# Patient Record
Sex: Female | Born: 1970 | Race: White | Hispanic: No | Marital: Married | Smoking: Never smoker
Health system: Southern US, Community
[De-identification: ages and names within clinical notes are randomized; demographics above are authoritative.]

## PROBLEM LIST (undated history)

## (undated) HISTORY — PX: APPENDECTOMY: SHX54

---

## 2001-06-27 ENCOUNTER — Other Ambulatory Visit: Admission: RE | Admit: 2001-06-27 | Discharge: 2001-06-27 | Payer: Self-pay | Admitting: Obstetrics and Gynecology

## 2002-07-10 ENCOUNTER — Other Ambulatory Visit: Admission: RE | Admit: 2002-07-10 | Discharge: 2002-07-10 | Payer: Self-pay | Admitting: Obstetrics and Gynecology

## 2003-07-16 ENCOUNTER — Other Ambulatory Visit: Admission: RE | Admit: 2003-07-16 | Discharge: 2003-07-16 | Payer: Self-pay | Admitting: Obstetrics and Gynecology

## 2004-08-12 ENCOUNTER — Inpatient Hospital Stay (HOSPITAL_COMMUNITY): Admission: AD | Admit: 2004-08-12 | Discharge: 2004-08-12 | Payer: Self-pay | Admitting: Obstetrics and Gynecology

## 2004-08-14 ENCOUNTER — Inpatient Hospital Stay (HOSPITAL_COMMUNITY): Admission: AD | Admit: 2004-08-14 | Discharge: 2004-08-16 | Payer: Self-pay | Admitting: Obstetrics and Gynecology

## 2004-09-24 ENCOUNTER — Other Ambulatory Visit: Admission: RE | Admit: 2004-09-24 | Discharge: 2004-09-24 | Payer: Self-pay | Admitting: Obstetrics and Gynecology

## 2005-10-19 ENCOUNTER — Other Ambulatory Visit: Admission: RE | Admit: 2005-10-19 | Discharge: 2005-10-19 | Payer: Self-pay | Admitting: Obstetrics and Gynecology

## 2009-01-15 ENCOUNTER — Inpatient Hospital Stay (HOSPITAL_COMMUNITY): Admission: AD | Admit: 2009-01-15 | Discharge: 2009-01-17 | Payer: Self-pay | Admitting: Obstetrics and Gynecology

## 2010-12-21 LAB — CBC
HCT: 36.1 % (ref 36.0–46.0)
Hemoglobin: 13 g/dL (ref 12.0–15.0)
MCHC: 35.9 g/dL (ref 30.0–36.0)
MCHC: 36.3 g/dL — ABNORMAL HIGH (ref 30.0–36.0)
MCV: 102.8 fL — ABNORMAL HIGH (ref 78.0–100.0)
Platelets: 188 10*3/uL (ref 150–400)
RDW: 13.9 % (ref 11.5–15.5)

## 2011-01-25 NOTE — Discharge Summary (Signed)
NAMECORNELL, [name]               ACCOUNT NO.:  [id number]   MEDICAL RECORD NO.:  [id number]          PATIENT TYPE:  INP   LOCATION:  9146                          FACILITY:  [facility]   PHYSICIAN:  Sherron Monday, MD        DATE OF BIRTH:  06-Aug-1971   DATE OF ADMISSION:  01/15/2009  DATE OF DISCHARGE:  01/17/2009                               DISCHARGE SUMMARY   ADMITTING DIAGNOSIS:  Intrauterine pregnancy at term inactive labor.   DISCHARGE DIAGNOSES:  Intrauterine pregnancy at term inactive labor,  delivered via spontaneous vaginal delivery.   HISTORY OF PRESENT ILLNESS:  A 40 year old G2, P1-0-0-1 at 40 weeks and  6 days with an [facility] of Jan 16, 2009 by 7-week ultrasound presented to MAU  in active labor.  Vaginal exam revealed 8 cm dilated, completely  effaced, and 0 station with an intact bag.  Prenatal care was  uncomplicated except advanced maternal age with normal first trimester  screen.   PAST MEDICAL HISTORY:  Not significant.   PAST SURGICAL HISTORY:  Significant for appendectomy in 2000.   PAST OBSTETRICAL/GYNECOLOGICAL HISTORY:  She is a G3, P1.  She had a G1  was an 7 pound 2 ounces vaginal delivery in 2005.  G2 was a miscarriage  in 2009.  G3 is the present pregnancy.  No abnormal Pap smears or  sexually transmitted diseases.   MEDICATIONS:  Prenatal vitamins.   ALLERGIES:  The powder on latex gloves.   SOCIAL HISTORY:  Denies alcohol, tobacco, or drug use.  She is married.   PRENATAL LABS:  A positive, antibody screen negative, RPR nonreactive,  rubella immune, hepatitis B surface antigen negative, HIV negative.  Gonorrhea negative.  Chlamydia negative.  Group B strep negative.  First  trimester screen within normal limits.  One hour Glucola of 153.  Three-  hour GTT was within normal limits.   HOSPITAL COURSE:  On admission, exam was afebrile.  Vital signs stable  and a benign exam with reactive fetal heart tones.  She ruptured and  progressed rapidly to  complete.  She pushed well for approximately 15  minutes for a delivery of a viable female infant over second-degree  laceration with Apgars of 8 at 1 minute and 9 at 5 minutes and weight of  8 pounds 2 ounces.  Placenta delivered intact.  The laceration was  repaired with 2-0 Vicryl in a typical fashion.  Her postpartum course  was relatively uncomplicated.  She remained afebrile and vital signs  stable and was discharged to home on postpartum day #2 with routine  discharge instructions and numbers to call with any questions  or problems as well as prescriptions for Motrin, Percocet, and prenatal  vitamins.  She is A+ and rubella immune.  Her hemoglobin decreased from  13.0-11.4.  She was discharged to home.  She would discuss routine  contraception with Dr. Senaida Ores at her 6-week checkup.       Sherron Monday, MD  Electronically Signed     JB/MEDQ  D:  01/17/2009  T:  01/17/2009  Job:  [id number]

## 2011-01-28 NOTE — Discharge Summary (Signed)
[name], [name]               ACCOUNT NO.:  [id number]   MEDICAL RECORD NO.:  [id number]          PATIENT TYPE:  INP   LOCATION:  9112                          [facility]ILITY:  [facility]   PHYSICIAN:  Huel Cote, M.D. DATE OF BIRTH:  [birth year]   DATE OF ADMISSION:  08/14/2004  DATE OF DISCHARGE:  08/16/2004                                 DISCHARGE SUMMARY   DISCHARGE DIAGNOSES:  1.  Term pregnancy at 89 and six-sevenths weeks, delivered.  2.  Status post normal spontaneous vaginal delivery.   DISCHARGE MEDICATIONS:  Motrin 600 mg p.o. q.6h.   DISCHARGE FOLLOW-UP:  The patient is to follow up in the office in  approximately 6 weeks for her routine postpartum exam.   HOSPITAL COURSE:  The patient is a 40 year old G1 P0 who is admitted at 70  and six-sevenths weeks gestation with a complaint of regular contractions.  Cervix on admission was 5 cm with a bulging bag.  She received her epidural  and progressed to complete dilation without spontaneous rupture of  membranes.  Prenatal care was uncomplicated except for a prominent fetal  renal pelvis on initial anatomy scan and 32-week ultrasound repeat was  normal.  Prenatal labs are as follows:  A positive, antibody negative, toxo  negative, RPR nonreactive, rubella nonimmune, hepatitis B surface antigen  negative, HIV declined, GC negative, chlamydia negative, group B strep  negative, 1-hour Glucola is 127.  Past OB history:  None.  Past GYN history:  None.  Past medical history:  None.  Past surgical history:  Appendectomy.  Allergies:  None.  On admission, she was afebrile with stable vital signs.  Fetal heart rate was reactive.  Her contractions progressed well and she  reached complete dilation after admission.  She then had rupture of  membranes performed with the vertex noted at the 0 station and a transverse  presentation.  The patient then began to push and pushed for approximately 1-  and-a-half hours with a normal spontaneous  vaginal delivery of a vigorous  female infant in an ROP presentation over a second degree laceration.  Apgars were 8 and 9, weight was 7 pounds 2 ounces.  Second degree laceration  was repaired with 2-0 and 3-0 Vicryl.  Cervix and rectum were intact.  The  patient did well and on postpartum day #2 was breastfeeding well and her  pain was well controlled and she was felt stable for discharge home.     Kath   KR/MEDQ  D:  09/06/2004  T:  09/06/2004  Job:  [id number]

## 2011-08-08 ENCOUNTER — Ambulatory Visit (HOSPITAL_COMMUNITY)
Admission: AD | Admit: 2011-08-08 | Discharge: 2011-08-09 | Disposition: A | Discharge status: Home / Self Care | Payer: BC Managed Care – PPO | Source: Ambulatory Visit | Attending: Obstetrics and Gynecology | Admitting: Obstetrics and Gynecology

## 2011-08-08 ENCOUNTER — Encounter (HOSPITAL_COMMUNITY): Payer: Self-pay | Admitting: *Deleted

## 2011-08-08 DIAGNOSIS — O034 Incomplete spontaneous abortion without complication: Secondary | ICD-10-CM

## 2011-08-08 LAB — CBC
MCV: 95.3 fL (ref 78.0–100.0)
Platelets: 273 10*3/uL (ref 150–400)
RDW: 13.2 % (ref 11.5–15.5)
WBC: 8.6 10*3/uL (ref 4.0–10.5)

## 2011-08-08 MED ORDER — LACTATED RINGERS IV SOLN
INTRAVENOUS | Status: DC
  Administered 2011-08-08: 23:00:00 via INTRAVENOUS

## 2011-08-08 NOTE — H&P (Signed)
[name] is a 40 y.o. female [phone] with a known diagnosed missed abortion, Used cytotec 07/12/11 and passed some tissue, but has continued to have intermittent heavy bleeding and cramping and [place] in office today showed retained POC's. Pt came into MAU tonight with c/o severe back pain, bleeding is still light for now.  Pt very tearful with pain, has not eaten since 0600am and last drank at 1pm.  She requests D&E tonight for her pain. History OB History    Grav Para Term Preterm Abortions TAB SAB Ect Mult Living   4 2 2  1  1   2     NSVD x 2  History reviewed. No pertinent past medical history. Past Surgical History  Procedure Date  . Appendectomy    Family History: family history includes Arthritis in her mother; Cancer in her maternal grandfather and maternal grandmother; Diabetes in her father and paternal grandfather; Heart disease in her father and mother; Hypertension in her father; and Stroke in her father. Social History:  reports that she has never smoked. She does not have any smokeless tobacco history on file. She reports that she does not drink alcohol or use illicit drugs.  ROS Severe cramping and pain   Blood pressure 129/59, pulse 80, temperature 98.7 F (37.1 C), resp. rate 16, height 5\' 1"  (1.549 m), weight 69.854 kg (154 lb), last menstrual period 05/09/2011, SpO2 98.00%. Exam Physical Exam  Constitutional: She appears well-developed and well-nourished.  Cardiovascular: Normal rate and regular rhythm.   Respiratory: Effort normal and breath sounds normal.  GI: Soft.  Genitourinary: Vagina normal and uterus normal.       Cervix closed with scant blood in vault  Neurological: She is alert.    Prenatal labs: ABO, Rh:  Rh positive Antibody:   Rubella:   RPR:    HBsAg:    HIV:    GBS:     Assessment/Plan: Pt for D&E tomorrow but requesting tonight due to severe pain.  Bleeding is not heavy currently.  D/w anesthesia and they are agreeable when OR  available--C/S currently underway. Risks of surgery d/w pt in detail (bleeding and uterine [perforation) and she desires to proceed.  Oliver Pila 08/08/2011, 10:32 PM

## 2011-08-08 NOTE — Progress Notes (Signed)
Pt passed golf ball sized clots this past Friday.  Sat and Sun were mild days, pt worked all day today with increased bleeding and low back pain that has increased over today.  Went to office and was scanned to see that there is still membranes and blood flow in uterus.  Scheduled for D&E in AM.  Came in tonight because low back pain is so bad.

## 2011-08-08 NOTE — Progress Notes (Signed)
Pt LMP 05/09/2011, MAB, cytotec 10/30, had bleeding an cramping off and on, passed tissue.  Bleeding and cramping increased today had U/s in the office, retained products noted.  Pt scheduled for D and E 11/27.  Increased pain tonight.

## 2011-08-09 ENCOUNTER — Encounter (HOSPITAL_COMMUNITY)
Admission: AD | Disposition: A | Discharge status: Home / Self Care | Payer: Self-pay | Source: Ambulatory Visit | Attending: Obstetrics and Gynecology

## 2011-08-09 ENCOUNTER — Encounter (HOSPITAL_COMMUNITY): Payer: Self-pay | Admitting: Anesthesiology

## 2011-08-09 ENCOUNTER — Other Ambulatory Visit: Payer: Self-pay | Admitting: Obstetrics and Gynecology

## 2011-08-09 ENCOUNTER — Ambulatory Visit: Admit: 2011-08-09 | Payer: Self-pay | Admitting: Obstetrics and Gynecology

## 2011-08-09 ENCOUNTER — Ambulatory Visit (HOSPITAL_COMMUNITY)
Admission: RE | Admit: 2011-08-09 | Payer: BC Managed Care – PPO | Source: Ambulatory Visit | Admitting: Obstetrics and Gynecology

## 2011-08-09 SURGERY — DILATION AND EVACUATION, UTERUS
Anesthesia: Choice

## 2011-08-09 SURGERY — Surgical Case
Anesthesia: *Unknown

## 2011-08-09 SURGERY — DILATION AND EVACUATION, UTERUS
Anesthesia: Monitor Anesthesia Care

## 2011-08-09 SURGICAL SUPPLY — 19 items
CATH ROBINSON RED A/P 16FR (CATHETERS) ×2 IMPLANT
CLOTH BEACON ORANGE TIMEOUT ST (SAFETY) ×2 IMPLANT
DECANTER SPIKE VIAL GLASS SM (MISCELLANEOUS) ×2 IMPLANT
DRAPE UTILITY XL STRL (DRAPES) ×2 IMPLANT
GLOVE BIO SURGEON STRL SZ8 (GLOVE) ×2 IMPLANT
GLOVE ORTHO TXT STRL SZ7.5 (GLOVE) ×2 IMPLANT
GOWN PREVENTION PLUS LG XLONG (DISPOSABLE) ×2 IMPLANT
KIT BERKELEY 1ST TRIMESTER 3/8 (MISCELLANEOUS) ×2 IMPLANT
NEEDLE SPNL 22GX3.5 QUINCKE BK (NEEDLE) ×2 IMPLANT
NS IRRIG 1000ML POUR BTL (IV SOLUTION) ×2 IMPLANT
PACK VAGINAL MINOR WOMEN LF (CUSTOM PROCEDURE TRAY) ×2 IMPLANT
PAD PREP 24X48 CUFFED NSTRL (MISCELLANEOUS) ×2 IMPLANT
SET BERKELEY SUCTION TUBING (SUCTIONS) ×2 IMPLANT
SYR CONTROL 10ML LL (SYRINGE) ×2 IMPLANT
TOWEL OR 17X24 6PK STRL BLUE (TOWEL DISPOSABLE) ×4 IMPLANT
VACURETTE 10 RIGID CVD (CANNULA) IMPLANT
VACURETTE 7MM CVD STRL WRAP (CANNULA) IMPLANT
VACURETTE 8 RIGID CVD (CANNULA) IMPLANT
VACURETTE 9 RIGID CVD (CANNULA) IMPLANT

## 2011-08-09 NOTE — Anesthesia Preprocedure Evaluation (Addendum)
Anesthesia Evaluation  Patient identified by MRN, date of birth, ID band Patient awake    Reviewed: Allergy & Precautions, H&P , Patient's Chart, lab work & pertinent test results, reviewed documented beta blocker date and time   Airway Mallampati: II TM Distance: >3 FB Neck ROM: full    Dental No notable dental hx.    Pulmonary  clear to auscultation  Pulmonary exam normal       Cardiovascular regular Normal    Neuro/Psych    GI/Hepatic   Endo/Other    Renal/GU      Musculoskeletal   Abdominal   Peds  Hematology   Anesthesia Other Findings   Reproductive/Obstetrics                           Anesthesia Physical Anesthesia Plan  ASA: I and Emergent  Anesthesia Plan: MAC   Post-op Pain Management:    Induction: Intravenous  Airway Management Planned: Oral ETT and Mask  Additional Equipment:   Intra-op Plan:   Post-operative Plan:   Informed Consent: I have reviewed the patients History and Physical, chart, labs and discussed the procedure including the risks, benefits and alternatives for the proposed anesthesia with the patient or authorized representative who has indicated his/her understanding and acceptance.   Dental Advisory Given  Plan Discussed with: CRNA and Surgeon  Anesthesia Plan Comments: (  Discussed  general anesthesia, including possible nausea, instrumentation of airway, sore throat,pulmonary aspiration, etc. I asked if the were any outstanding questions, or  concerns before we proceeded. )        Anesthesia Quick Evaluation

## 2011-08-09 NOTE — Anesthesia Preprocedure Evaluation (Deleted)
Anesthesia Evaluation  Patient identified by MRN, date of birth, ID band Patient awake    Reviewed: Allergy & Precautions, H&P , Patient's Chart, lab work & pertinent test results, reviewed documented beta blocker date and time   Airway Mallampati: II TM Distance: >3 FB Neck ROM: full    Dental No notable dental hx.    Pulmonary  clear to auscultation  Pulmonary exam normal       Cardiovascular regular Normal    Neuro/Psych    GI/Hepatic   Endo/Other    Renal/GU      Musculoskeletal   Abdominal   Peds  Hematology   Anesthesia Other Findings   Reproductive/Obstetrics                           Anesthesia Physical Anesthesia Plan  ASA: II and Emergent  Anesthesia Plan: MAC   Post-op Pain Management:    Induction: Intravenous  Airway Management Planned: Mask  Additional Equipment:   Intra-op Plan:   Post-operative Plan:   Informed Consent: I have reviewed the patients History and Physical, chart, labs and discussed the procedure including the risks, benefits and alternatives for the proposed anesthesia with the patient or authorized representative who has indicated his/her understanding and acceptance.   Dental Advisory Given  Plan Discussed with: CRNA and Surgeon  Anesthesia Plan Comments: (  Discussed  general anesthesia, including possible nausea, instrumentation of airway, sore throat,pulmonary aspiration, etc. I asked if the were any outstanding questions, or  concerns before we proceeded. )        Anesthesia Quick Evaluation

## 2011-08-09 NOTE — Op Note (Signed)
Preoperative diagnosis Incomplete abortion with retained POC's  Postoperative diagnosis  Same  Procedure Dilation and evacuation  Surgeon Huel Cote, MD  Anesthesia MAC and paracervical block  Findings Retained POC's uterus sounded 7 cm  Fluids EBL minimal IVF 1200 cc LR  Specimen POC's to pathology  Procedure Note  After informed consent obtained, pt was taken to the operating room where sedation anesthesia was obtained without difficulty.  An appropriate timeout was performed, and pt was prepped in the dorsal lithotomy position in the normal sterile fashion.  A speculum placed within the vagina exposed the cervix which was injected with 1% plain lidocaine in a paracervical block with 20 cc.  The uterus easily sounded to 7cm and the pratt dilators passed easily through the cervix which appeared dilated.  A 7mm suction curette was introduced into the cavity and in several passes a small amount of POC's obtained.  Suction was discontinued and a sharp curettage performed with minimal tissue noted and the cavity felt clear.  Two additional passes with the suction revealed no further tissue.  The instruments were removed from the vagina and the tenaculum site treated with silver nitrate.  No active bleeding was noted.  The pt was taken to PACU in good condition.

## 2011-08-09 NOTE — Anesthesia Postprocedure Evaluation (Signed)
  Anesthesia Post-op Note  Patient: [name]  Procedure(s) Performed:  DILATATION AND EVACUATION (D&E)  Patient is awake and responsive. Pain and nausea are reasonably well controlled. Vital signs are stable and clinically acceptable. Oxygen saturation is clinically acceptable. There are no apparent anesthetic complications at this time. Patient is ready for discharge.

## 2011-08-09 NOTE — Brief Op Note (Signed)
08/08/2011 - 2011/08/26  1:10 AM  PATIENT:  [name]  40 y.o. female  PRE-OPERATIVE DIAGNOSIS:  Incomplete abortion with retained products of conception  POST-OPERATIVE DIAGNOSIS:  same  PROCEDURE:  Procedure(s): DILATATION AND EVACUATION (D&E)  SURGEON:  Huel Cote  ANESTHESIA:   IV sedation and paracervical block  EBL:     BLOOD ADMINISTERED:none  DRAINS: none   LOCAL MEDICATIONS USED:  LIDOCAINE 20CC 1% plain  SPECIMEN: POC's  DISPOSITION OF SPECIMEN:  PATHOLOGY  COUNTS:  YES  DICTATION: .Dragon Dictation  PLAN OF CARE: Discharge to home after PACU  PATIENT DISPOSITION:  PACU - hemodynamically stable.

## 2011-08-11 ENCOUNTER — Encounter (HOSPITAL_COMMUNITY): Payer: Self-pay | Admitting: Obstetrics and Gynecology

## 2011-08-13 DEATH — deceased

## 2011-08-18 ENCOUNTER — Encounter (HOSPITAL_COMMUNITY): Payer: Self-pay | Admitting: Obstetrics and Gynecology

## 2014-07-14 ENCOUNTER — Encounter (HOSPITAL_COMMUNITY): Payer: Self-pay | Admitting: Obstetrics and Gynecology

## 2014-09-25 ENCOUNTER — Encounter (HOSPITAL_COMMUNITY): Payer: Self-pay | Admitting: Obstetrics and Gynecology

## 2015-02-19 ENCOUNTER — Encounter (HOSPITAL_COMMUNITY): Payer: Self-pay | Admitting: Obstetrics and Gynecology

## 2015-11-17 ENCOUNTER — Institutional Professional Consult (permissible substitution): Payer: Self-pay | Admitting: Internal Medicine

## 2015-11-18 ENCOUNTER — Other Ambulatory Visit (INDEPENDENT_AMBULATORY_CARE_PROVIDER_SITE_OTHER): Payer: BLUE CROSS/BLUE SHIELD

## 2015-11-18 ENCOUNTER — Ambulatory Visit (INDEPENDENT_AMBULATORY_CARE_PROVIDER_SITE_OTHER): Payer: BLUE CROSS/BLUE SHIELD | Admitting: Internal Medicine

## 2015-11-18 ENCOUNTER — Encounter (INDEPENDENT_AMBULATORY_CARE_PROVIDER_SITE_OTHER): Payer: Self-pay

## 2015-11-18 ENCOUNTER — Ambulatory Visit (INDEPENDENT_AMBULATORY_CARE_PROVIDER_SITE_OTHER)
Admission: RE | Admit: 2015-11-18 | Discharge: 2015-11-18 | Disposition: A | Discharge status: Home / Self Care | Payer: BLUE CROSS/BLUE SHIELD | Source: Ambulatory Visit | Attending: Internal Medicine | Admitting: Internal Medicine

## 2015-11-18 ENCOUNTER — Encounter: Payer: Self-pay | Admitting: Internal Medicine

## 2015-11-18 VITALS — BP 122/84 | HR 100 | Ht 61.0 in | Wt 161.0 lb

## 2015-11-18 DIAGNOSIS — R05 Cough: Secondary | ICD-10-CM

## 2015-11-18 DIAGNOSIS — R058 Other specified cough: Secondary | ICD-10-CM

## 2015-11-18 DIAGNOSIS — J45991 Cough variant asthma: Secondary | ICD-10-CM | POA: Diagnosis not present

## 2015-11-18 LAB — CBC WITH DIFFERENTIAL/PLATELET
Basophils Absolute: 0.1 10*3/uL (ref 0.0–0.1)
Basophils Relative: 1.2 % (ref 0.0–3.0)
Eosinophils Absolute: 1.1 10*3/uL — ABNORMAL HIGH (ref 0.0–0.7)
Eosinophils Relative: 11.9 % — ABNORMAL HIGH (ref 0.0–5.0)
HEMATOCRIT: 41.1 % (ref 36.0–46.0)
Hemoglobin: 14.6 g/dL (ref 12.0–15.0)
LYMPHS PCT: 25.7 % (ref 12.0–46.0)
Lymphs Abs: 2.3 10*3/uL (ref 0.7–4.0)
MCHC: 35.5 g/dL (ref 30.0–36.0)
MCV: 95.3 fl (ref 78.0–100.0)
MONOS PCT: 5.6 % (ref 3.0–12.0)
Monocytes Absolute: 0.5 10*3/uL (ref 0.1–1.0)
NEUTROS ABS: 4.9 10*3/uL (ref 1.4–7.7)
Neutrophils Relative %: 55.6 % (ref 43.0–77.0)
PLATELETS: 318 10*3/uL (ref 150.0–400.0)
RBC: 4.31 Mil/uL (ref 3.87–5.11)
RDW: 13.4 % (ref 11.5–15.5)
WBC: 8.9 10*3/uL (ref 4.0–10.5)

## 2015-11-18 LAB — SEDIMENTATION RATE: Sed Rate: 7 mm/hr (ref 0–22)

## 2015-11-18 MED ORDER — PREDNISONE 10 MG PO TABS: ORAL_TABLET | ORAL | Status: DC

## 2015-11-18 MED ORDER — FAMOTIDINE 20 MG PO TABS: ORAL_TABLET | ORAL | Status: DC

## 2015-11-18 MED ORDER — PANTOPRAZOLE SODIUM 40 MG PO TBEC: 40.0000 mg | DELAYED_RELEASE_TABLET | Freq: Every day | ORAL | Status: DC

## 2015-11-18 NOTE — Patient Instructions (Addendum)
Please remember to go to the lab and x-ray department downstairs for your tests - we will call you with the results when they are available.  The key to effective treatment for your cough is eliminating the non-stop cycle of cough you're stuck in long enough to let your airway heal completely and then see if there is anything still making you cough once you stop the cough suppression, but this should take no more than 5 days to figure out  First take delsym two tsp every 12 hours and supplement if needed with  tramadol 50 mg up to 2 every 4 hours to suppress the urge to cough at all or even clear your throat. Swallowing water or using ice chips/non mint and menthol containing candies (such as lifesavers or sugarless jolly ranchers) are also effective.  You should rest your voice and avoid activities that you know make you cough.  Once you have eliminated the cough for 3 straight days try reducing the tramadol first,  then the delsym as tolerated.    Prednisone 10 mg take  4 each am x 2 days,   2 each am x 2 days,  1 each am x 2 days and stop (this is to eliminate allergies and inflammation from coughing)  Protonix (pantoprazole) Take 30-60 min before first meal of the day and Pepcid 20 mg one bedtime plus Chlorpheniramine 4 mg x 2 at bedtime (both available over the counter)  until cough is completely gone for at least a week without the need for cough suppression  GERD (REFLUX)  is an extremely common cause of respiratory symptoms, many times with no significant heartburn at all.    It can be treated with medication, but also with lifestyle changes including avoidance of late meals, excessive alcohol, smoking cessation, and avoid fatty foods, chocolate, peppermint, colas, red wine, and acidic juices such as orange juice.  NO MINT OR MENTHOL PRODUCTS SO NO COUGH DROPS  USE HARD CANDY INSTEAD (jolley ranchers or Stover's or Lifesavers (all available in sugarless versions) NO OIL BASED VITAMINS - use  powdered substitutes.  If not better in two weeks call for next step, if all better tell your friends!

## 2015-11-18 NOTE — Progress Notes (Signed)
Subjective:     Patient ID: [name], female   DOB: 13-Mar-1971      MRN: [id number]  HPI  72 yowf vet never smoker never allergies with new onset noct cough Oct 2016 no better in [place] nor [place] D, then sick over xmas 2016  with URI dx rhintis/sinusitis at [facility] rx with augmentin/pred  x 7days 100% improved  and stayed gone x one week then same problem plus doe Oct 03 2015 then R cp under breast > dx with abn cxr [place] > dx pna/ levaquin  less intense pain but still coughing so  referred by [name] in [place] 11/18/2015    11/18/2015 1st [place] [facility] office visit/ [name]   Chief Complaint  Patient presents with  . Pulmonary Consult    Self referral. Pt c/o cough x 5 months. She was dxed with PNA 10/05/15. Her cough is prod at times with minimal thick, white sputum.  She has also noticed occ wheezing.   cough worse every single night between 1 -3am > min mucus, no purulent sputum or mucus plugs  Or significant hemoptysis/ assoc with some subjective wheeze which saba eliminates for up to 6 hours  No obvious day to day or daytime variability or assoc chronic  chest tightness,   or overt sinus or hb symptoms. No unusual exp hx or h/o childhood pna/ asthma or knowledge of premature birth.  Sleeping ok without nocturnal  or early am exacerbation  of respiratory  c/o's or need for noct saba. Also denies any obvious fluctuation of symptoms with weather or environmental changes or other aggravating or alleviating factors except as outlined above   Current Medications, Allergies, Complete Past Medical History, Past Surgical History, Family History, and Social History were reviewed in [employer] record.  ROS  The following are not active complaints unless bolded sore throat, dysphagia, dental problems, itching, sneezing,  nasal congestion or excess/ purulent secretions, ear ache,   fever, chills, sweats, unintended wt loss, classically  exertional cp, hemoptysis,   orthopnea pnd or leg swelling, presyncope, palpitations, abdominal pain, anorexia, nausea, vomiting, diarrhea  or change in bowel or bladder habits, change in stools or urine, dysuria,hematuria,  rash, arthralgias, visual complaints, headache, numbness, weakness or ataxia or problems with walking or coordination,  change in mood/affect or memory.       Review of Systems     Objective:   Physical Exam Amb wm nad with barking quality   Wt Readings from Last 3 Encounters:  11/18/15 161 lb (73.029 kg)  08/08/11 154 lb (69.854 kg)    Vital signs reviewed    HEENT: nl dentition, turbinates, and oropharynx. Nl external ear canals without cough reflex   NECK :  without JVD/Nodes/TM/ nl carotid upstrokes bilaterally   LUNGS: no acc muscle use,  Nl contour chest which is clear to A and P bilaterally with urge to cough variably  on exp   CV:  RRR  no s3 or murmur or increase in P2, no edema   ABD:  soft and nontender with nl inspiratory excursion in the supine position. No bruits or organomegaly, bowel sounds nl  MS:  Nl gait/ ext warm without deformities, calf tenderness, cyanosis or clubbing No obvious joint restrictions   SKIN: warm and dry without lesions    NEURO:  alert, approp, nl sensorium with  no motor deficits     CXR PA and Lateral:   11/18/2015 :    I personally  reviewed images and agree with radiology impression as follows:    There is no active cardiopulmonary disease.   labs 11/18/2015  Eos 1.4/ ESR 7 Allergy profile pending       Assessment:

## 2015-11-19 ENCOUNTER — Encounter: Payer: Self-pay | Admitting: Internal Medicine

## 2015-11-19 ENCOUNTER — Telehealth: Payer: Self-pay | Admitting: Internal Medicine

## 2015-11-19 LAB — RESPIRATORY ALLERGY PROFILE REGION II ~~LOC~~
Allergen, Cedar tree, t12: <0.1 kU/L
Allergen, Cottonwood, t14: <0.1 kU/L
Allergen, D pternoyssinus,d7: <0.1 kU/L
Allergen, Mouse Urine Protein, e78: <0.1 kU/L
Aspergillus fumigatus, m3: <0.1 kU/L
Bermuda Grass: <0.1 kU/L
Cat Dander: 1.57 kU/L — ABNORMAL HIGH
Common Ragweed: <0.1 kU/L
D. farinae: <0.1 kU/L
DOG DANDER: 0.48 kU/L — AB
IGE (IMMUNOGLOBULIN E), SERUM: 54 kU/L (ref <115)
Pecan/Hickory Tree IgE: <0.1 kU/L
Timothy Grass: <0.1 kU/L

## 2015-11-19 MED ORDER — TRAMADOL HCL 50 MG PO TABS: 50.0000 mg | ORAL_TABLET | ORAL | Status: AC | PRN

## 2015-11-19 NOTE — Telephone Encounter (Signed)
Pt told pharmacy that Rx for Tramadol was supposed to be sent in 11/18/15 Verbal order given via telephone, per last OV notes patient was advised to take Tramadol very 4 hours prn cough for cough suppression.. Nothing further needed.

## 2015-11-19 NOTE — Assessment & Plan Note (Signed)
The most common causes of chronic cough in immunocompetent adults include the following: upper airway cough syndrome (UACS), previously referred to as postnasal drip syndrome (PNDS), which is caused by variety of rhinosinus conditions; (2) asthma; (3) GERD; (4) chronic bronchitis from cigarette smoking or other inhaled environmental irritants; (5) nonasthmatic eosinophilic bronchitis; and (6) bronchiectasis.   These conditions, singly or in combination, have accounted for up to 94% of the causes of chronic cough in prospective studies.   Other conditions have constituted no >6% of the causes in prospective studies These have included bronchogenic carcinoma, chronic interstitial pneumonia, sarcoidosis, left ventricular failure, ACEI-induced cough, and aspiration from a condition associated with pharyngeal dysfunction.    Chronic cough is often simultaneously caused by more than one condition. A single cause has been found from 38 to 82% of the time, multiple causes from 18 to 62%. Multiply caused cough has been the result of three diseases up to 42% of the time.       The cough she demonstrated today is clearly upper airway but the previous reported response to prednisone and saba assoc with Eos of 1.4 strongly point to dx of sinusitis/ eos rhinitis/bronchitis or cough variant asthma.  For now though will try to control cyclical cough and only short term prednsone then proceed with with if recurs which will include trial of low dose ICS +/- singulair, sinus CT and possible allergy referral  I had an extended discussion with the patient reviewing all relevant studies completed to date and  lasting  35 minutes of a 60 minute initial  visit    Each maintenance medication was reviewed in detail including most importantly the difference between maintenance and prns and under what circumstances the prns are to be triggered using an action plan format that is not reflected in the computer generated  alphabetically organized AVS.    Please see instructions for details which were reviewed in writing and the patient given a copy highlighting the part that I personally wrote and discussed at today's ov.

## 2015-11-19 NOTE — Progress Notes (Signed)
Quick Note:  LMTCB ______ 

## 2015-11-20 ENCOUNTER — Telehealth: Payer: Self-pay | Admitting: Internal Medicine

## 2015-11-20 NOTE — Progress Notes (Signed)
Quick Note:  LMTCB ______ 

## 2015-11-20 NOTE — Telephone Encounter (Signed)
Sure we can do that and go over all the studies in more detail then as they may or may not be relevant depending on response to rx in the meantime

## 2015-11-20 NOTE — Telephone Encounter (Signed)
Result Note     Call patient : Studies are C/w allergies to cat > dog avoid if possible esp in bedroom   Per CXR: Result Note     Call pt: Reviewed cxr and no acute change so no change in recommendations made at ov  --  I spoke with patient about results and she verbalized understanding. She reports she does not have any animals in the bedroom. 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 She reports she is bothered the most at night. She is also requesting to go over radiology films when she comes in for her next appointment as well Please advise MW thanks

## 2015-11-20 NOTE — Telephone Encounter (Signed)
Called made pt aware. Nothing further needed 

## 2015-11-20 NOTE — Progress Notes (Signed)
Quick Note:  lmtcb ______ 

## 2015-12-16 ENCOUNTER — Ambulatory Visit (INDEPENDENT_AMBULATORY_CARE_PROVIDER_SITE_OTHER): Payer: BLUE CROSS/BLUE SHIELD | Admitting: Internal Medicine

## 2015-12-16 ENCOUNTER — Encounter: Payer: Self-pay | Admitting: Internal Medicine

## 2015-12-16 DIAGNOSIS — J45991 Cough variant asthma: Secondary | ICD-10-CM

## 2015-12-16 LAB — NITRIC OXIDE: NITRIC OXIDE: 96

## 2015-12-16 MED ORDER — PREDNISONE 10 MG PO TABS: ORAL_TABLET | ORAL | Status: AC

## 2015-12-16 MED ORDER — MOMETASONE FURO-FORMOTEROL FUM 100-5 MCG/ACT IN AERO: INHALATION_SPRAY | RESPIRATORY_TRACT | Status: AC

## 2015-12-16 MED ORDER — TRAMADOL HCL 50 MG PO TABS: ORAL_TABLET | ORAL | Status: AC

## 2015-12-16 NOTE — Patient Instructions (Addendum)
Plan A = Automatic = Dulera 100 Take 2 puffs first thing in am and then another 2 puffs about 12 hours later.   Work on inhaler technique:  relax and gently blow all the way out then take a nice smooth deep breath back in, triggering the inhaler at same time you start breathing in.  Hold for up to 5 seconds if you can. Blow out thru nose. Rinse and gargle with water when done    Plan B = Backup Only use your albuterol as a rescue medication to be used if you can't catch your breath by resting or doing a relaxed purse lip breathing pattern.  - The less you use it, the better it will work when you need it. - Ok to use up to 2 puffs  every 4 hours if you must but call for appointment if use goes up over your usual need - Don't leave home without it !!  (think of it like the spare tire for your car)   Please see patient coordinator before you leave today  to schedule [name] first available   If not better > Prednisone 10 mg take  4 each am x 2 days,   2 each am x 2 days,  1 each am x 2 days and stop   Pulmonary follow up is as needed

## 2015-12-16 NOTE — Assessment & Plan Note (Signed)
Allergy profile 11/18/2015 >  Eos 1.4 /  IgE  54 Pos Rast Cat > dog > referred to allergy 12/16/2015  - cyclical cough rx 11/18/2015 > short term relief only  - NO 12/16/2015 = 96 - 12/16/2015  extensive coaching HFA effectiveness =    75% > dulera 100 2bid    I had an extended final summary discussion with the patient reviewing all relevant studies completed to date and  lasting 25 minutes of a 40 minute visit (and req 3 trips back to the exam room to update/clarify imp/rec) on the following issues:    1) not clear why she now has active asthma but the pred response and RAST suggest an allergic mech and needs a trial of dulera 100 and f/u in [place] with Dr Lucie LeatherKozlow  2) unlikely GERD is the primary problem as cough broke right thru rx so ok to stop  3) most of the time was spent reviewing her clinical course up to her last ov and subsequent care with the issue of "what was wrong - I was told I had pna at [facility] (note xrays not in our system, and we did not see her during this time so hard to sort out now even in retrospect with clear cxr and exam now)   4) Each maintenance medication was reviewed in detail including most importantly the difference between maintenance and as needed and under what circumstances the prns are to be used.  Please see instructions for details which were reviewed in writing and the patient given a copy.    Pulmonary f/u is prn

## 2015-12-16 NOTE — Progress Notes (Signed)
Subjective:     Patient ID: [name], female   DOB: December 12, 1970      MRN: [id number]    Brief patient profile:  35 yowf vet never smoker never allergies with new onset noct cough Oct 2016 no better in [place] nor [place], then sick over xmas 2016  with URI dx rhintis/sinusitis at [facility] rx with augmentin/pred  x 7days 100% improved  and stayed gone x one week then same problem plus doe Oct 03 2015 then R cp under breast > dx with abn cxr [place] > dx pna/ levaquin  less intense pain but still coughing so  referred by Dr Casper Harrison in [place] 11/18/2015    History of Present Illness  11/18/2015 1st  Pulmonary office visit/ [name]   Chief Complaint  Patient presents with  . Pulmonary Consult    Self referral. Pt c/o cough x 5 months. She was dxed with PNA 10/05/15. Her cough is prod at times with minimal thick, white sputum.  She has also noticed occ wheezing.   cough worse every single night between 1 -3am > min mucus, no purulent sputum or mucus plugs  Or significant hemoptysis/ assoc with some subjective wheeze which saba eliminates for up to 6 hours rec  First take delsym two tsp every 12 hours and supplement if needed with  tramadol 50 mg up to 2 every 4 hours to suppress the urge to cough at all or even clear your throat.  Once you have eliminated the cough for 3 straight days try reducing the tramadol first,  then the delsym as tolerated.   Prednisone 10 mg take  4 each am x 2 days,   2 each am x 2 days,  1 each am x 2 days and stop (this is to eliminate allergies and inflammation from coughing) Protonix (pantoprazole) Take 30-60 min before first meal of the day and Pepcid 20 mg one bedtime plus Chlorpheniramine 4 mg x 2 at bedtime (both available over the counter)  until cough is completely gone for at least a week without the need for cough suppression GERD diet    12/16/2015 extended summary  ov/[name] re: prob cough variant asthma   Chief Complaint  Patient presents with  . Follow-up     Cough completely resolved on pred and then returned as soon as she finished med. No new co's today. Wheezing and "tightness in throat" are worse after stopping pred.    finished protonix and pepcid April 2 with breakthru cough w/in a few days of completing pred just like past rx  /  Just on as needed albuterol at this point  Feels fullness in throat then starts coughing fits esp when gets in car but also hs - 3 am completely eliminated on pred but now again needing saba which only controls for a few hours whereas used to control for 6   No obvious day to day or daytime variability or assoc chronic  chest tightness,   or overt sinus or hb symptoms. No unusual exp hx or h/o childhood pna/ asthma or knowledge of premature birth.  Also denies any obvious fluctuation of symptoms with weather or environmental changes or other aggravating or alleviating factors except as outlined above   Current Medications, Allergies, Complete Past Medical History, Past Surgical History, Family History, and Social History were reviewed in [employer] record.  ROS  The following are not active complaints unless bolded sore throat, dysphagia, dental problems, itching, sneezing,  nasal congestion or excess/ purulent secretions, ear ache,   fever, chills, sweats, unintended wt loss, classically  exertional cp, hemoptysis,  orthopnea pnd or leg swelling, presyncope, palpitations, abdominal pain, anorexia, nausea, vomiting, diarrhea  or change in bowel or bladder habits, change in stools or urine, dysuria,hematuria,  rash, arthralgias, visual complaints, headache, numbness, weakness or ataxia or problems with walking or coordination,  change in mood/affect or memory.             Objective:   Physical Exam  Amb wf nasal tone to voice/ o/w nad but easily upset when discussing details of care "I just don't know what's wrong with me"  Wt Readings from Last 3 Encounters:  12/16/15 152 lb (68.947 kg)   11/18/15 161 lb (73.029 kg)  08/08/11 154 lb (69.854 kg)    Vital signs reviewed    HEENT: nl dentition, turbinates, and oropharynx. Nl external ear canals without cough reflex   NECK :  without JVD/Nodes/TM/ nl carotid upstrokes bilaterally   LUNGS: no acc muscle use,  Nl contour chest which is clear to A and P bilaterally with no cough on insp/exp 8 h p last saba    CV:  RRR  no s3 or murmur or increase in P2, no edema   ABD:  soft and nontender with nl inspiratory excursion in the supine position. No bruits or organomegaly, bowel sounds nl  MS:  Nl gait/ ext warm without deformities, calf tenderness, cyanosis or clubbing No obvious joint restrictions   SKIN: warm and dry without lesions    NEURO:  alert, very anxious , nl sensorium with  no motor deficits     CXR PA and Lateral:   11/18/2015 :    I personally reviewed images and agree with radiology impression as follows:    There is no active cardiopulmonary disease.          Assessment:

## 2016-01-06 ENCOUNTER — Ambulatory Visit: Payer: Self-pay | Admitting: Allergy and Immunology

## 2016-02-03 ENCOUNTER — Other Ambulatory Visit: Payer: Self-pay

## 2016-02-03 MED ORDER — FAMOTIDINE 20 MG PO TABS: ORAL_TABLET | ORAL | Status: AC

## 2016-02-03 MED ORDER — PANTOPRAZOLE SODIUM 40 MG PO TBEC: 40.0000 mg | DELAYED_RELEASE_TABLET | Freq: Every day | ORAL | Status: AC

## 2016-06-08 ENCOUNTER — Other Ambulatory Visit: Payer: Self-pay | Admitting: Obstetrics and Gynecology

## 2016-06-08 DIAGNOSIS — R928 Other abnormal and inconclusive findings on diagnostic imaging of breast: Secondary | ICD-10-CM

## 2016-06-13 ENCOUNTER — Ambulatory Visit
Admission: RE | Admit: 2016-06-13 | Discharge: 2016-06-13 | Disposition: A | Discharge status: Home / Self Care | Payer: BLUE CROSS/BLUE SHIELD | Source: Ambulatory Visit | Attending: Obstetrics and Gynecology | Admitting: Obstetrics and Gynecology

## 2016-06-13 DIAGNOSIS — R928 Other abnormal and inconclusive findings on diagnostic imaging of breast: Secondary | ICD-10-CM

## 2016-09-14 DIAGNOSIS — J324 Chronic pansinusitis: Secondary | ICD-10-CM | POA: Diagnosis not present

## 2016-09-14 DIAGNOSIS — R05 Cough: Secondary | ICD-10-CM | POA: Diagnosis not present

## 2016-09-14 DIAGNOSIS — J3089 Other allergic rhinitis: Secondary | ICD-10-CM | POA: Diagnosis not present

## 2016-12-24 IMAGING — DX DG CHEST 2V
2 series · 2 of 2 positions shown · non-contrast
Comparison: Chest x-ray of May 27, 2013

CLINICAL DATA: History of chronic asthma

EXAM:
CHEST  2 VIEW

[chest pa]
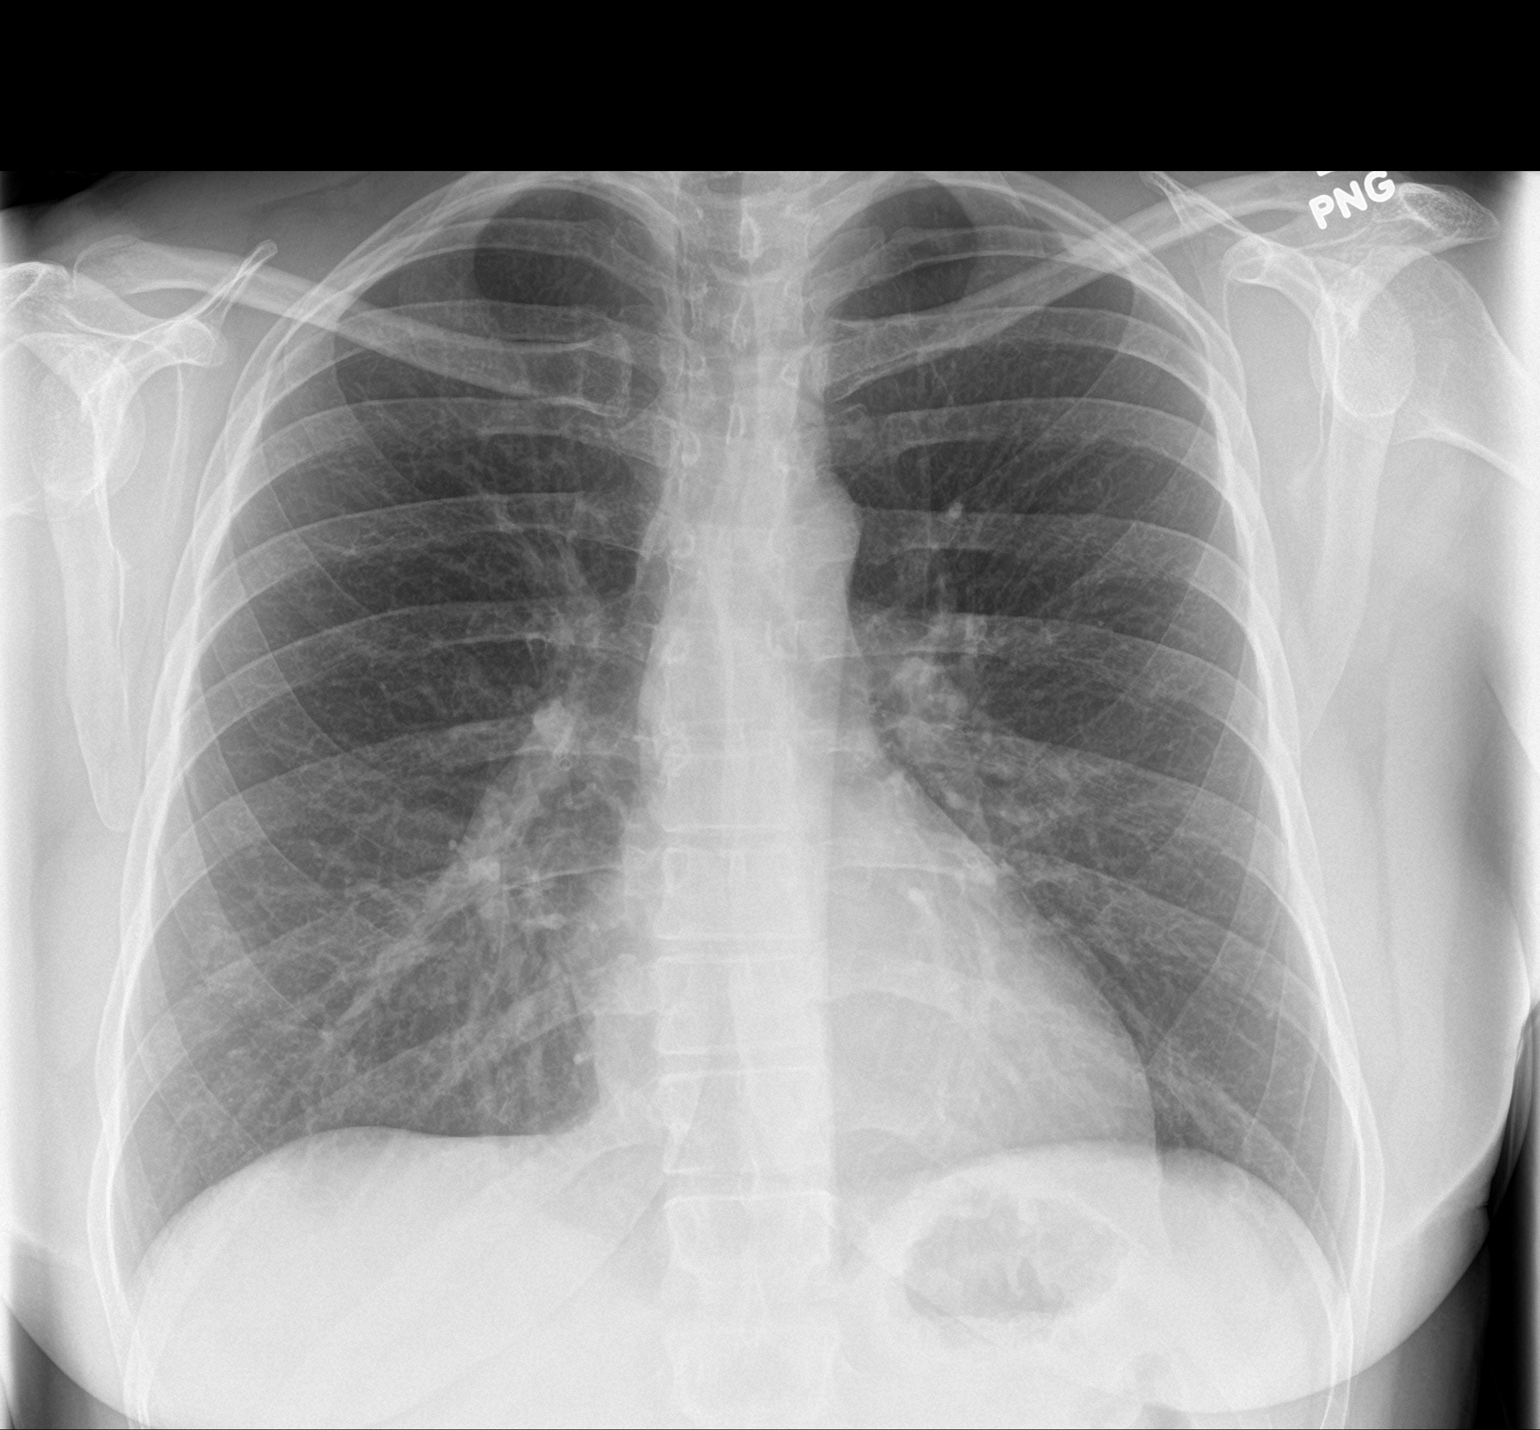

[chest lat]
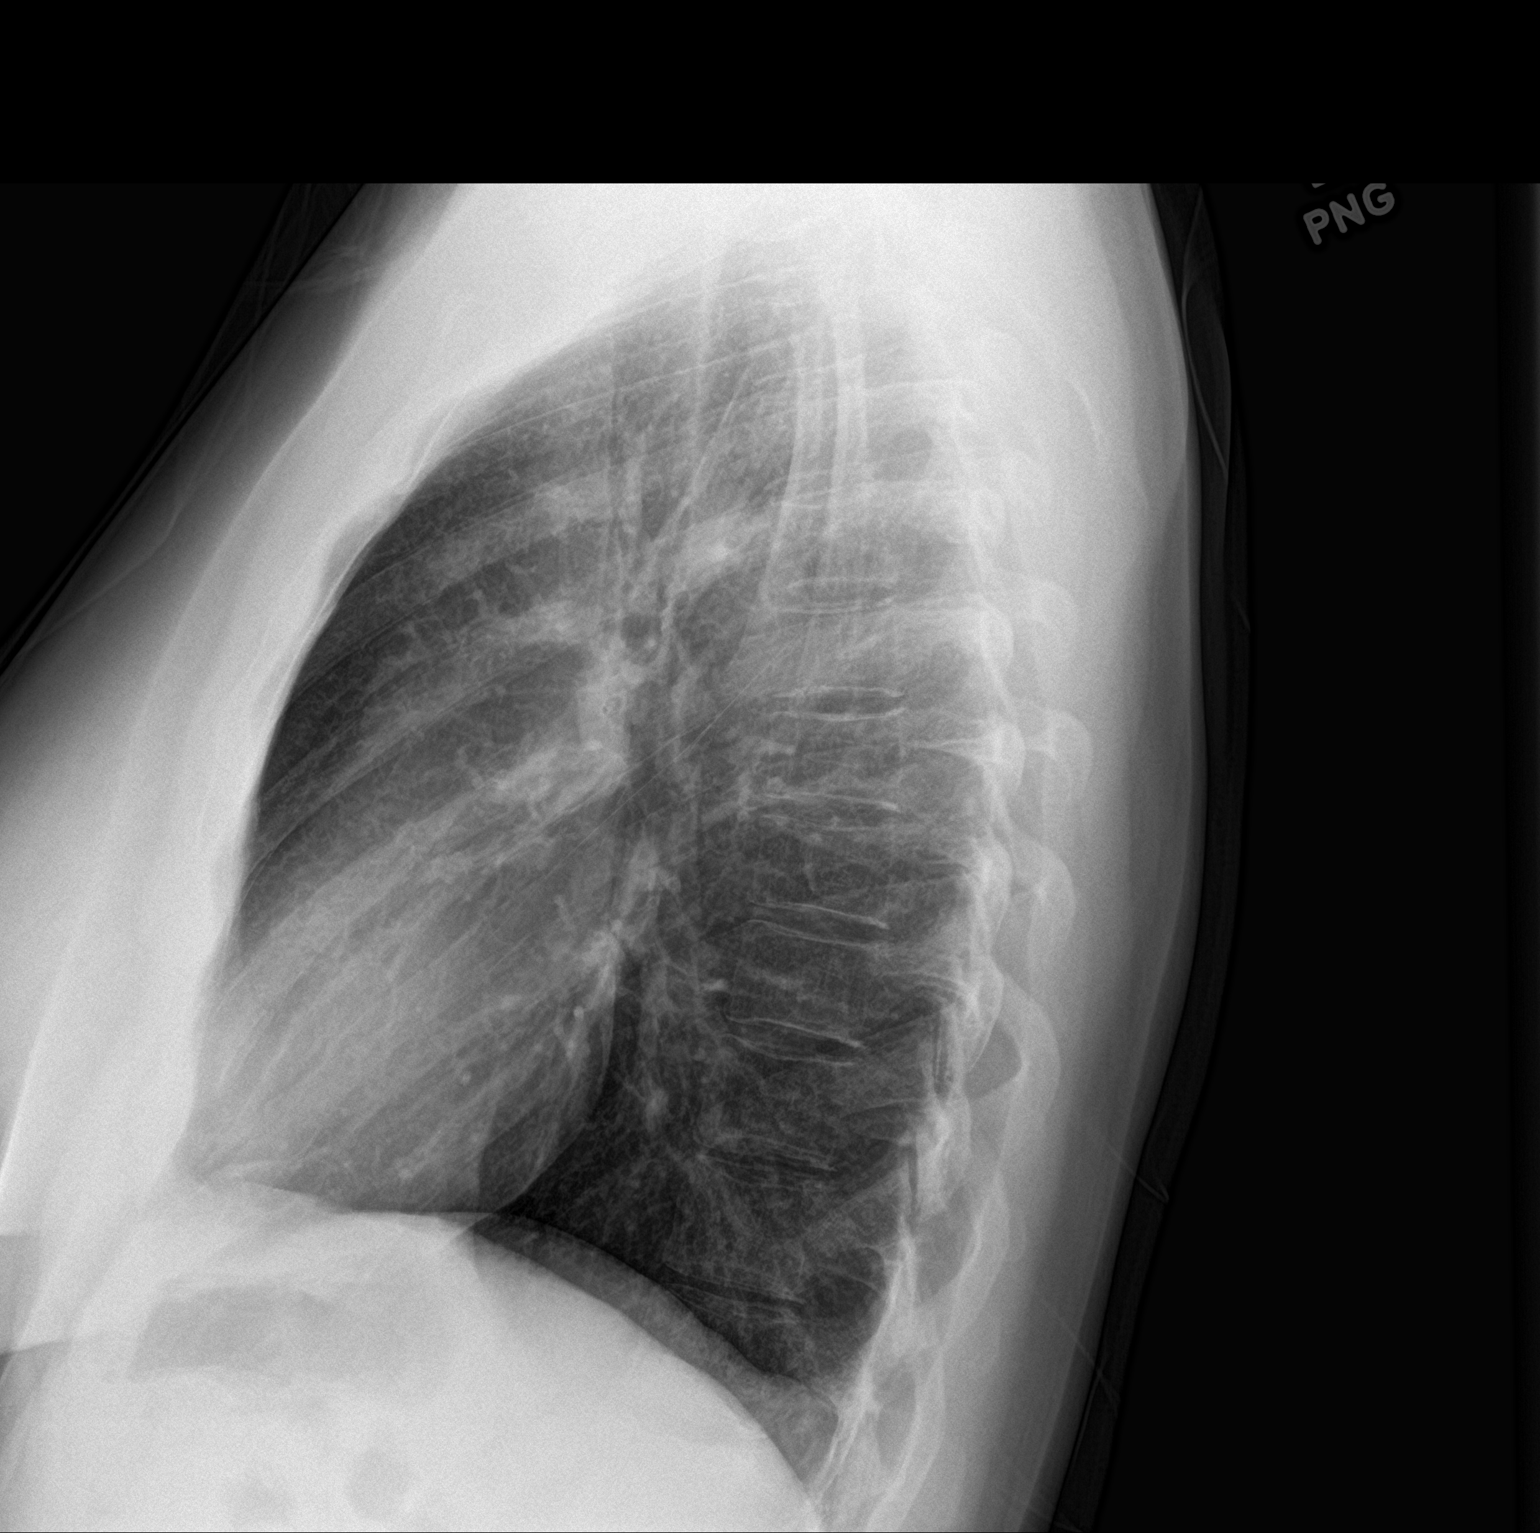

[2 of 2 positions shown; findings below may reference images not displayed]

FINDINGS: The lungs are well-expanded and clear. The heart and pulmonary
vascularity are normal. The mediastinum is normal in width. There is
no pleural effusion. The bony thorax is unremarkable.
IMPRESSION: There is no active cardiopulmonary disease.

## 2016-12-28 DIAGNOSIS — J324 Chronic pansinusitis: Secondary | ICD-10-CM | POA: Diagnosis not present

## 2016-12-28 DIAGNOSIS — J3089 Other allergic rhinitis: Secondary | ICD-10-CM | POA: Diagnosis not present

## 2016-12-28 DIAGNOSIS — R05 Cough: Secondary | ICD-10-CM | POA: Diagnosis not present

## 2016-12-28 DIAGNOSIS — J343 Hypertrophy of nasal turbinates: Secondary | ICD-10-CM | POA: Diagnosis not present

## 2017-01-11 DIAGNOSIS — J324 Chronic pansinusitis: Secondary | ICD-10-CM | POA: Diagnosis not present

## 2017-01-11 DIAGNOSIS — Z01818 Encounter for other preprocedural examination: Secondary | ICD-10-CM | POA: Diagnosis not present

## 2017-01-11 DIAGNOSIS — J343 Hypertrophy of nasal turbinates: Secondary | ICD-10-CM | POA: Diagnosis not present

## 2017-01-11 DIAGNOSIS — J3089 Other allergic rhinitis: Secondary | ICD-10-CM | POA: Diagnosis not present

## 2017-01-17 DIAGNOSIS — J324 Chronic pansinusitis: Secondary | ICD-10-CM | POA: Diagnosis not present

## 2017-01-17 DIAGNOSIS — R05 Cough: Secondary | ICD-10-CM | POA: Diagnosis not present

## 2017-01-17 DIAGNOSIS — J343 Hypertrophy of nasal turbinates: Secondary | ICD-10-CM | POA: Diagnosis not present

## 2017-01-17 DIAGNOSIS — Z79899 Other long term (current) drug therapy: Secondary | ICD-10-CM | POA: Diagnosis not present

## 2017-01-17 DIAGNOSIS — J3089 Other allergic rhinitis: Secondary | ICD-10-CM | POA: Diagnosis not present

## 2017-01-17 DIAGNOSIS — E079 Disorder of thyroid, unspecified: Secondary | ICD-10-CM | POA: Diagnosis not present

## 2017-01-26 DIAGNOSIS — J324 Chronic pansinusitis: Secondary | ICD-10-CM | POA: Diagnosis not present

## 2017-02-01 DIAGNOSIS — J324 Chronic pansinusitis: Secondary | ICD-10-CM | POA: Diagnosis not present

## 2017-06-22 DIAGNOSIS — D485 Neoplasm of uncertain behavior of skin: Secondary | ICD-10-CM | POA: Diagnosis not present

## 2017-06-22 DIAGNOSIS — D225 Melanocytic nevi of trunk: Secondary | ICD-10-CM | POA: Diagnosis not present

## 2017-06-22 DIAGNOSIS — B079 Viral wart, unspecified: Secondary | ICD-10-CM | POA: Diagnosis not present

## 2017-06-28 DIAGNOSIS — Z6828 Body mass index (BMI) 28.0-28.9, adult: Secondary | ICD-10-CM | POA: Diagnosis not present

## 2017-06-28 DIAGNOSIS — Z130 Encounter for screening for diseases of the blood and blood-forming organs and certain disorders involving the immune mechanism: Secondary | ICD-10-CM | POA: Diagnosis not present

## 2017-06-28 DIAGNOSIS — Z01419 Encounter for gynecological examination (general) (routine) without abnormal findings: Secondary | ICD-10-CM | POA: Diagnosis not present

## 2017-06-28 DIAGNOSIS — Z1389 Encounter for screening for other disorder: Secondary | ICD-10-CM | POA: Diagnosis not present

## 2017-06-28 DIAGNOSIS — Z1231 Encounter for screening mammogram for malignant neoplasm of breast: Secondary | ICD-10-CM | POA: Diagnosis not present

## 2017-06-28 DIAGNOSIS — Z3041 Encounter for surveillance of contraceptive pills: Secondary | ICD-10-CM | POA: Diagnosis not present

## 2017-09-20 DIAGNOSIS — J324 Chronic pansinusitis: Secondary | ICD-10-CM | POA: Diagnosis not present

## 2017-09-20 DIAGNOSIS — R05 Cough: Secondary | ICD-10-CM | POA: Diagnosis not present

## 2018-01-17 DIAGNOSIS — J3089 Other allergic rhinitis: Secondary | ICD-10-CM | POA: Diagnosis not present

## 2018-01-17 DIAGNOSIS — J324 Chronic pansinusitis: Secondary | ICD-10-CM | POA: Diagnosis not present

## 2018-04-25 DIAGNOSIS — J324 Chronic pansinusitis: Secondary | ICD-10-CM | POA: Diagnosis not present

## 2018-05-09 DIAGNOSIS — B079 Viral wart, unspecified: Secondary | ICD-10-CM | POA: Diagnosis not present

## 2018-05-09 DIAGNOSIS — L820 Inflamed seborrheic keratosis: Secondary | ICD-10-CM | POA: Diagnosis not present

## 2018-06-06 DIAGNOSIS — J329 Chronic sinusitis, unspecified: Secondary | ICD-10-CM | POA: Diagnosis not present

## 2018-06-06 DIAGNOSIS — Z01818 Encounter for other preprocedural examination: Secondary | ICD-10-CM | POA: Diagnosis not present

## 2018-06-15 DIAGNOSIS — J019 Acute sinusitis, unspecified: Secondary | ICD-10-CM | POA: Diagnosis not present

## 2018-06-15 DIAGNOSIS — J329 Chronic sinusitis, unspecified: Secondary | ICD-10-CM | POA: Diagnosis not present

## 2018-06-15 DIAGNOSIS — J324 Chronic pansinusitis: Secondary | ICD-10-CM | POA: Diagnosis not present

## 2018-06-21 DIAGNOSIS — J324 Chronic pansinusitis: Secondary | ICD-10-CM | POA: Diagnosis not present

## 2018-07-04 DIAGNOSIS — J324 Chronic pansinusitis: Secondary | ICD-10-CM | POA: Diagnosis not present

## 2018-07-05 DIAGNOSIS — Z1339 Encounter for screening examination for other mental health and behavioral disorders: Secondary | ICD-10-CM | POA: Diagnosis not present

## 2018-07-05 DIAGNOSIS — R635 Abnormal weight gain: Secondary | ICD-10-CM | POA: Diagnosis not present

## 2018-07-05 DIAGNOSIS — R609 Edema, unspecified: Secondary | ICD-10-CM | POA: Diagnosis not present

## 2018-07-05 DIAGNOSIS — Z1331 Encounter for screening for depression: Secondary | ICD-10-CM | POA: Diagnosis not present

## 2018-07-11 DIAGNOSIS — Z6833 Body mass index (BMI) 33.0-33.9, adult: Secondary | ICD-10-CM | POA: Diagnosis not present

## 2018-07-11 DIAGNOSIS — Z130 Encounter for screening for diseases of the blood and blood-forming organs and certain disorders involving the immune mechanism: Secondary | ICD-10-CM | POA: Diagnosis not present

## 2018-07-11 DIAGNOSIS — Z1231 Encounter for screening mammogram for malignant neoplasm of breast: Secondary | ICD-10-CM | POA: Diagnosis not present

## 2018-07-11 DIAGNOSIS — Z01419 Encounter for gynecological examination (general) (routine) without abnormal findings: Secondary | ICD-10-CM | POA: Diagnosis not present

## 2018-07-11 DIAGNOSIS — Z1389 Encounter for screening for other disorder: Secondary | ICD-10-CM | POA: Diagnosis not present

## 2018-07-18 DIAGNOSIS — Z6833 Body mass index (BMI) 33.0-33.9, adult: Secondary | ICD-10-CM | POA: Diagnosis not present

## 2018-07-18 DIAGNOSIS — R197 Diarrhea, unspecified: Secondary | ICD-10-CM | POA: Diagnosis not present

## 2018-07-19 DIAGNOSIS — R197 Diarrhea, unspecified: Secondary | ICD-10-CM | POA: Diagnosis not present

## 2018-07-20 DIAGNOSIS — R197 Diarrhea, unspecified: Secondary | ICD-10-CM | POA: Diagnosis not present

## 2018-07-26 DIAGNOSIS — B079 Viral wart, unspecified: Secondary | ICD-10-CM | POA: Diagnosis not present

## 2018-07-30 DIAGNOSIS — Z6833 Body mass index (BMI) 33.0-33.9, adult: Secondary | ICD-10-CM | POA: Diagnosis not present

## 2018-07-30 DIAGNOSIS — E669 Obesity, unspecified: Secondary | ICD-10-CM | POA: Diagnosis not present

## 2018-08-22 DIAGNOSIS — J324 Chronic pansinusitis: Secondary | ICD-10-CM | POA: Diagnosis not present

## 2018-11-21 DIAGNOSIS — R05 Cough: Secondary | ICD-10-CM | POA: Diagnosis not present

## 2018-11-21 DIAGNOSIS — J324 Chronic pansinusitis: Secondary | ICD-10-CM | POA: Diagnosis not present

## 2018-11-21 DIAGNOSIS — J3089 Other allergic rhinitis: Secondary | ICD-10-CM | POA: Diagnosis not present

## 2018-11-26 DIAGNOSIS — E669 Obesity, unspecified: Secondary | ICD-10-CM | POA: Diagnosis not present

## 2018-11-26 DIAGNOSIS — Z6833 Body mass index (BMI) 33.0-33.9, adult: Secondary | ICD-10-CM | POA: Diagnosis not present

## 2019-04-08 DIAGNOSIS — Z79899 Other long term (current) drug therapy: Secondary | ICD-10-CM | POA: Diagnosis not present

## 2019-04-08 DIAGNOSIS — Z6830 Body mass index (BMI) 30.0-30.9, adult: Secondary | ICD-10-CM | POA: Diagnosis not present

## 2019-04-08 DIAGNOSIS — E669 Obesity, unspecified: Secondary | ICD-10-CM | POA: Diagnosis not present

## 2019-04-08 DIAGNOSIS — E041 Nontoxic single thyroid nodule: Secondary | ICD-10-CM | POA: Diagnosis not present

## 2019-04-08 DIAGNOSIS — R739 Hyperglycemia, unspecified: Secondary | ICD-10-CM | POA: Diagnosis not present

## 2019-05-29 DIAGNOSIS — J3089 Other allergic rhinitis: Secondary | ICD-10-CM | POA: Diagnosis not present

## 2019-05-29 DIAGNOSIS — R05 Cough: Secondary | ICD-10-CM | POA: Diagnosis not present

## 2019-05-29 DIAGNOSIS — J324 Chronic pansinusitis: Secondary | ICD-10-CM | POA: Diagnosis not present

## 2019-07-31 DIAGNOSIS — Z124 Encounter for screening for malignant neoplasm of cervix: Secondary | ICD-10-CM | POA: Diagnosis not present

## 2019-07-31 DIAGNOSIS — Z01419 Encounter for gynecological examination (general) (routine) without abnormal findings: Secondary | ICD-10-CM | POA: Diagnosis not present

## 2019-07-31 DIAGNOSIS — Z130 Encounter for screening for diseases of the blood and blood-forming organs and certain disorders involving the immune mechanism: Secondary | ICD-10-CM | POA: Diagnosis not present

## 2019-07-31 DIAGNOSIS — Z1151 Encounter for screening for human papillomavirus (HPV): Secondary | ICD-10-CM | POA: Diagnosis not present

## 2019-07-31 DIAGNOSIS — Z1231 Encounter for screening mammogram for malignant neoplasm of breast: Secondary | ICD-10-CM | POA: Diagnosis not present

## 2019-12-11 DIAGNOSIS — J324 Chronic pansinusitis: Secondary | ICD-10-CM | POA: Diagnosis not present

## 2019-12-11 DIAGNOSIS — J3089 Other allergic rhinitis: Secondary | ICD-10-CM | POA: Diagnosis not present

## 2019-12-11 DIAGNOSIS — R05 Cough: Secondary | ICD-10-CM | POA: Diagnosis not present

## 2020-03-11 DIAGNOSIS — B079 Viral wart, unspecified: Secondary | ICD-10-CM | POA: Diagnosis not present

## 2020-04-08 DIAGNOSIS — B079 Viral wart, unspecified: Secondary | ICD-10-CM | POA: Diagnosis not present

## 2020-05-06 DIAGNOSIS — B079 Viral wart, unspecified: Secondary | ICD-10-CM | POA: Diagnosis not present

## 2020-05-13 DIAGNOSIS — Z20828 Contact with and (suspected) exposure to other viral communicable diseases: Secondary | ICD-10-CM | POA: Diagnosis not present

## 2020-06-03 DIAGNOSIS — B079 Viral wart, unspecified: Secondary | ICD-10-CM | POA: Diagnosis not present

## 2020-06-10 DIAGNOSIS — J324 Chronic pansinusitis: Secondary | ICD-10-CM | POA: Diagnosis not present

## 2020-07-01 DIAGNOSIS — B079 Viral wart, unspecified: Secondary | ICD-10-CM | POA: Diagnosis not present

## 2020-07-01 DIAGNOSIS — D225 Melanocytic nevi of trunk: Secondary | ICD-10-CM | POA: Diagnosis not present

## 2020-07-01 DIAGNOSIS — B351 Tinea unguium: Secondary | ICD-10-CM | POA: Diagnosis not present

## 2020-07-22 DIAGNOSIS — B079 Viral wart, unspecified: Secondary | ICD-10-CM | POA: Diagnosis not present

## 2020-08-12 DIAGNOSIS — Z01419 Encounter for gynecological examination (general) (routine) without abnormal findings: Secondary | ICD-10-CM | POA: Diagnosis not present

## 2020-08-12 DIAGNOSIS — Z1231 Encounter for screening mammogram for malignant neoplasm of breast: Secondary | ICD-10-CM | POA: Diagnosis not present

## 2020-08-12 DIAGNOSIS — Z6828 Body mass index (BMI) 28.0-28.9, adult: Secondary | ICD-10-CM | POA: Diagnosis not present

## 2020-08-12 DIAGNOSIS — Z1389 Encounter for screening for other disorder: Secondary | ICD-10-CM | POA: Diagnosis not present

## 2020-08-12 DIAGNOSIS — Z130 Encounter for screening for diseases of the blood and blood-forming organs and certain disorders involving the immune mechanism: Secondary | ICD-10-CM | POA: Diagnosis not present

## 2020-12-09 DIAGNOSIS — J324 Chronic pansinusitis: Secondary | ICD-10-CM | POA: Diagnosis not present

## 2021-05-26 DIAGNOSIS — E890 Postprocedural hypothyroidism: Secondary | ICD-10-CM | POA: Diagnosis not present

## 2021-05-26 DIAGNOSIS — Z0000 Encounter for general adult medical examination without abnormal findings: Secondary | ICD-10-CM | POA: Diagnosis not present

## 2021-05-26 DIAGNOSIS — J309 Allergic rhinitis, unspecified: Secondary | ICD-10-CM | POA: Diagnosis not present

## 2021-06-16 DIAGNOSIS — J324 Chronic pansinusitis: Secondary | ICD-10-CM | POA: Diagnosis not present

## 2021-06-16 DIAGNOSIS — R059 Cough, unspecified: Secondary | ICD-10-CM | POA: Diagnosis not present

## 2021-08-18 DIAGNOSIS — Z130 Encounter for screening for diseases of the blood and blood-forming organs and certain disorders involving the immune mechanism: Secondary | ICD-10-CM | POA: Diagnosis not present

## 2021-08-18 DIAGNOSIS — Z1389 Encounter for screening for other disorder: Secondary | ICD-10-CM | POA: Diagnosis not present

## 2021-08-18 DIAGNOSIS — Z6835 Body mass index (BMI) 35.0-35.9, adult: Secondary | ICD-10-CM | POA: Diagnosis not present

## 2021-08-18 DIAGNOSIS — Z1231 Encounter for screening mammogram for malignant neoplasm of breast: Secondary | ICD-10-CM | POA: Diagnosis not present

## 2021-08-18 DIAGNOSIS — Z01419 Encounter for gynecological examination (general) (routine) without abnormal findings: Secondary | ICD-10-CM | POA: Diagnosis not present
